# Patient Record
Sex: Female | Born: 1995 | Race: Asian | Hispanic: No | Marital: Single | Smoking: Former smoker
Health system: Southern US, Community
[De-identification: ages and names within clinical notes are randomized; demographics above are authoritative.]

## PROBLEM LIST (undated history)

## (undated) ENCOUNTER — Inpatient Hospital Stay (HOSPITAL_COMMUNITY): Payer: Self-pay

## (undated) ENCOUNTER — Emergency Department (HOSPITAL_COMMUNITY): Discharge status: Absent without leave | Payer: Self-pay

## (undated) DIAGNOSIS — Z789 Other specified health status: Secondary | ICD-10-CM

## (undated) HISTORY — PX: WISDOM TOOTH EXTRACTION: SHX21

## (undated) HISTORY — PX: ANKLE SURGERY: SHX546

---
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
## 2018-04-01 ENCOUNTER — Ambulatory Visit (INDEPENDENT_AMBULATORY_CARE_PROVIDER_SITE_OTHER)
Admission: EM | Admit: 2018-04-01 | Discharge: 2018-04-01 | Disposition: A | Discharge status: Home / Self Care | Payer: 59 | Source: Home / Self Care | Attending: Family Medicine | Admitting: Family Medicine

## 2018-04-01 ENCOUNTER — Inpatient Hospital Stay (HOSPITAL_COMMUNITY)
Admission: AD | Admit: 2018-04-01 | Discharge: 2018-04-01 | Disposition: A | Discharge status: Home / Self Care | Payer: 59 | Source: Ambulatory Visit | Attending: Obstetrics and Gynecology | Admitting: Obstetrics and Gynecology

## 2018-04-01 ENCOUNTER — Encounter (HOSPITAL_COMMUNITY): Payer: Self-pay | Admitting: *Deleted

## 2018-04-01 ENCOUNTER — Encounter (HOSPITAL_COMMUNITY): Payer: Self-pay | Admitting: Emergency Medicine

## 2018-04-01 ENCOUNTER — Other Ambulatory Visit: Payer: Self-pay

## 2018-04-01 DIAGNOSIS — Z87891 Personal history of nicotine dependence: Secondary | ICD-10-CM | POA: Diagnosis not present

## 2018-04-01 DIAGNOSIS — Z3A01 Less than 8 weeks gestation of pregnancy: Secondary | ICD-10-CM | POA: Diagnosis not present

## 2018-04-01 DIAGNOSIS — R112 Nausea with vomiting, unspecified: Secondary | ICD-10-CM | POA: Diagnosis present

## 2018-04-01 DIAGNOSIS — O210 Mild hyperemesis gravidarum: Secondary | ICD-10-CM

## 2018-04-01 DIAGNOSIS — Z3201 Encounter for pregnancy test, result positive: Secondary | ICD-10-CM

## 2018-04-01 DIAGNOSIS — O219 Vomiting of pregnancy, unspecified: Secondary | ICD-10-CM | POA: Insufficient documentation

## 2018-04-01 HISTORY — DX: Other specified health status: Z78.9

## 2018-04-01 LAB — POCT PREGNANCY, URINE: Preg Test, Ur: POSITIVE — AB

## 2018-04-01 LAB — URINALYSIS, ROUTINE W REFLEX MICROSCOPIC
Bilirubin Urine: NEGATIVE
GLUCOSE, UA: NEGATIVE mg/dL
Hgb urine dipstick: NEGATIVE
Ketones, ur: NEGATIVE mg/dL
LEUKOCYTES UA: NEGATIVE
NITRITE: NEGATIVE
PROTEIN: NEGATIVE mg/dL
Specific Gravity, Urine: 1.021 (ref 1.005–1.030)
pH: 7 (ref 5.0–8.0)

## 2018-04-01 MED ORDER — PYRIDOXINE HCL 25 MG PO TABS: 25.0000 mg | ORAL_TABLET | Freq: Three times a day (TID) | ORAL | 0 refills | Status: DC

## 2018-04-01 MED ORDER — DOXYLAMINE SUCCINATE (SLEEP) 25 MG PO TABS: 25.0000 mg | ORAL_TABLET | Freq: Three times a day (TID) | ORAL | 0 refills | Status: DC | PRN

## 2018-04-01 NOTE — Discharge Instructions (Signed)

## 2018-04-01 NOTE — ED Triage Notes (Signed)
Pt here for nausea and indigestion with vomiting x 2 weeks; pt sts LMP was 7/17 and had positive home pregnancy test

## 2018-04-01 NOTE — Discharge Instructions (Signed)
Go to the [facility]  Check in maternity admissions

## 2018-04-01 NOTE — ED Provider Notes (Signed)
[facility]    CSN: [id number] Arrival date & time: 04/01/18  1207     History   Chief Complaint Chief Complaint  Patient presents with  . Nausea    HPI Megan Leach is a 22 y.o. female.   HPI  She states her last menstrual period was 02/12/2018.  She had a home pregnancy test.  She knows that she is pregnant.  She is a 17-month-old at home.  She is happy to be pregnant.  She is here with her husband.  She states about 2 weeks ago she started vomiting.  She has been vomiting several times a day every day.  She feels like she cannot hold down any solid food.  Sometimes she vomits even after drinking water.  She does not feel like she is dehydrated.  She states that she has no appetite and she is afraid to eat because she throws up so much.  She does have nausea even when she has not been eating.  No diarrhea.  No abdominal pain.  No vaginal bleeding or cramping.  Past Medical History:  Diagnosis Date  . Medical history non-contributory     There are no active problems to display for this patient.   Past Surgical History:  Procedure Laterality Date  . ANKLE SURGERY    . WISDOM TOOTH EXTRACTION      OB History    Gravida  2   Para  1   Term      Preterm      AB      Living  1     SAB      TAB      Ectopic      Multiple      Live Births               Home Medications    Prior to Admission medications   Medication Sig Start Date End Date Taking? Authorizing Provider  doxylamine, Sleep, (UNISOM) 25 MG tablet Take 1 tablet (25 mg total) by mouth every 8 (eight) hours as needed. 04/01/18   Calvert Cantor, CNM  pyridOXINE (VITAMIN B-6) 25 MG tablet Take 1 tablet (25 mg total) by mouth every 8 (eight) hours. 04/01/18   Calvert Cantor, CNM    Family History History reviewed. No pertinent family history. Patient states no significant family history, heart disease, cancers, strokes Social History Social History   Tobacco Use  .  Smoking status: Former Games developer  . Smokeless tobacco: Never Used  Substance Use Topics  . Alcohol use: Not Currently  . Drug use: Never     Allergies   Patient has no known allergies.   Review of Systems Review of Systems  Constitutional: Negative for chills and fever.  HENT: Negative for ear pain and sore throat.   Eyes: Negative for pain and visual disturbance.  Respiratory: Negative for cough and shortness of breath.   Cardiovascular: Negative for chest pain and palpitations.  Gastrointestinal: Positive for nausea and vomiting. Negative for abdominal pain.  Genitourinary: Negative for dysuria and hematuria.  Musculoskeletal: Negative for arthralgias and back pain.  Skin: Negative for color change and rash.  Neurological: Negative for seizures and syncope.  All other systems reviewed and are negative.    Physical Exam Triage Vital Signs ED Triage Vitals [04/01/18 1248]  Enc Vitals Group     BP 131/68     Pulse Rate 73     Resp 18     Temp  98.5 F (36.9 C)     Temp Source Oral     SpO2 100 %     Weight      Height      Head Circumference      Peak Flow      Pain Score      Pain Loc      Pain Edu?      Excl. in GC?    No data found.  Updated Vital Signs BP 131/68 (BP Location: Right Arm)   Pulse 73   Temp 98.5 F (36.9 C) (Oral)   Resp 18   LMP 02/12/2018   SpO2 100%      Physical Exam  Constitutional: She appears well-developed and well-nourished. No distress.  HENT:  Head: Normocephalic and atraumatic.  Mouth/Throat: Oropharynx is clear and moist.  Eyes: Pupils are equal, round, and reactive to light. Conjunctivae are normal.  Neck: Normal range of motion.  Cardiovascular: Normal rate.  Pulmonary/Chest: Effort normal. No respiratory distress.  Abdominal: Soft. She exhibits no distension.  Musculoskeletal: Normal range of motion. She exhibits no edema.  Neurological: She is alert.  Skin: Skin is warm and dry.  Vitals reviewed.    UC  Treatments / Results  Labs (all labs ordered are listed, but only abnormal results are displayed) Labs Reviewed - No data to display  EKG None  Radiology No results found.  Procedures Procedures (including critical care time)  Medications Ordered in UC Medications - No data to display  Initial Impression / Assessment and Plan / UC Course  I have reviewed the triage vital signs and the nursing notes.  Pertinent labs & imaging results that were available during my care of the patient were reviewed by me and considered in my medical decision making (see chart for details).     I called the obstetrician on call to ask about treatment.  I thought perhaps I could send her home with Unisom and pyridoxine, however, with her history that she had not eaten for 2 weeks was losing weight I was concerned about hyperemesis.  Dr. Jolayne Panther recommended that I send her to [facility] for evaluation. Final Clinical Impressions(s) / UC Diagnoses   Final diagnoses:  Hyperemesis gravidarum     Discharge Instructions     Go to the [facility]  Check in maternity admissions   ED Prescriptions    None     Controlled Substance Prescriptions [place] Controlled Substance Registry consulted? Not Applicable   Eustace Moore, MD 04/01/18 2121

## 2018-04-01 NOTE — MAU Note (Signed)
Been having really bad nausea and vomiting. Worse then first preg.  Unable to hold down, like any food.  Even small snacks.  Some pain/ discomfort in mid upper chest, ? Indigestion or heartburn.in upper abd, feels like there is a big bubble of pressure.

## 2018-04-01 NOTE — MAU Provider Note (Signed)
History     CSN: [id number]  Arrival date and time: 04/01/18 1344   First Provider Initiated Contact with Patient 04/01/18 1427      Chief Complaint  Patient presents with  . Nausea  . Possible Pregnancy   HPI  Kadasia Kassing is a 22 y.o. G2P1 at [redacted]w[redacted]d who presents to MAU with chief complaint of nausea and vomiting for the past week. Patient states she has been dry heaving throughout the day with "a few" episodes of vomiting daily. Now reports heartburn which worsens immediately after vomiting episodes. Denies vaginal bleeding, abdominal pain, fever, falls, or recent illness.    OB History    Gravida  2   Para  1   Term      Preterm      AB      Living  1     SAB      TAB      Ectopic      Multiple      Live Births              Past Medical History:  Diagnosis Date  . Medical history non-contributory     Past Surgical History:  Procedure Laterality Date  . ANKLE SURGERY    . WISDOM TOOTH EXTRACTION      History reviewed. No pertinent family history.  Social History   Tobacco Use  . Smoking status: Former Games developer  . Smokeless tobacco: Never Used  Substance Use Topics  . Alcohol use: Not Currently  . Drug use: Never    Allergies: No Known Allergies  No medications prior to admission.    Review of Systems  Constitutional: Negative for chills, fatigue and fever.  Gastrointestinal: Positive for nausea and vomiting.  All other systems reviewed and are negative.  Physical Exam   Blood pressure 112/73, pulse 73, temperature 98.3 F (36.8 C), temperature source Oral, resp. rate 17, weight 82.4 kg, last menstrual period 02/12/2018, SpO2 100 %.  Physical Exam  Nursing note and vitals reviewed. Constitutional: She is oriented to person, place, and time. She appears well-developed and well-nourished.  Cardiovascular: Normal rate.  Respiratory: Effort normal.  GI: Soft. She exhibits no distension. There is no tenderness. There is no rebound.   Genitourinary:  Genitourinary Comments: Not assessed based on HPI  Neurological: She is alert and oriented to person, place, and time. She has normal reflexes.  Skin: Skin is warm and dry.  Psychiatric: She has a normal mood and affect. Her behavior is normal. Judgment and thought content normal.    MAU Course  Procedures  MDM --[redacted]w[redacted]d by LMP --no abdominal cramping or bleeding --No concerning findings on physical exam --Discussed dietary changes in addition to rx  Patient Vitals for the past 24 hrs:  BP Temp Temp src Pulse Resp SpO2 Weight  04/01/18 1408 112/73 98.3 F (36.8 C) Oral 73 17 100 % 82.4 kg    Orders Placed This Encounter  Procedures  . Urinalysis, Routine w reflex microscopic  . Pregnancy, urine POC  . Discharge patient   Results for orders placed or performed during the hospital encounter of 04/01/18 (from the past 24 hour(s))  Urinalysis, Routine w reflex microscopic     Status: None   Collection Time: 04/01/18  2:12 PM  Result Value Ref Range   Color, Urine YELLOW YELLOW   APPearance CLEAR CLEAR   Specific Gravity, Urine 1.021 1.005 - 1.030   pH 7.0 5.0 - 8.0   Glucose, UA NEGATIVE  NEGATIVE mg/dL   Hgb urine dipstick NEGATIVE NEGATIVE   Bilirubin Urine NEGATIVE NEGATIVE   Ketones, ur NEGATIVE NEGATIVE mg/dL   Protein, ur NEGATIVE NEGATIVE mg/dL   Nitrite NEGATIVE NEGATIVE   Leukocytes, UA NEGATIVE NEGATIVE  Pregnancy, urine POC     Status: Abnormal   Collection Time: 04/01/18  2:16 PM  Result Value Ref Range   Preg Test, Ur POSITIVE (A) NEGATIVE   Meds ordered this encounter  Medications  . pyridOXINE (VITAMIN B-6) 25 MG tablet    Sig: Take 1 tablet (25 mg total) by mouth every 8 (eight) hours.    Dispense:  30 tablet    Refill:  0    Order Specific Question:   Supervising Provider    Answer:   Reva Bores [[id number]]  . doxylamine, Sleep, (UNISOM) 25 MG tablet    Sig: Take 1 tablet (25 mg total) by mouth every 8 (eight) hours as needed.     Dispense:  30 tablet    Refill:  0    Order Specific Question:   Supervising Provider    Answer:   Reva Bores [[id number]]   Assessment and Plan  --22 y.o. G2P1 at [redacted]w[redacted]d by LMP --Discussed dietary changes and generic Unisom as described above --Confirmed patient should start prenatal vitamin with folic acid when she can go 24 hours without vomiting --Given safe medication handout and list of [place] providers --Discharge home in stable condition  F/U: Patient to establish care with prenatal care provider  Calvert Cantor, CNM 04/01/2018, 2:56 PM

## 2018-04-22 LAB — OB RESULTS CONSOLE HIV ANTIBODY (ROUTINE TESTING): HIV: NONREACTIVE

## 2018-04-22 LAB — OB RESULTS CONSOLE HEPATITIS B SURFACE ANTIGEN: Hepatitis B Surface Ag: NEGATIVE

## 2018-04-22 LAB — OB RESULTS CONSOLE RUBELLA ANTIBODY, IGM: Rubella: IMMUNE

## 2018-04-22 LAB — OB RESULTS CONSOLE ABO/RH: RH Type: POSITIVE

## 2018-04-22 LAB — OB RESULTS CONSOLE ANTIBODY SCREEN: Antibody Screen: NEGATIVE

## 2018-04-22 LAB — OB RESULTS CONSOLE RPR: RPR: NONREACTIVE

## 2018-07-30 NOTE — L&D Delivery Note (Signed)
Delivery Note Patient progressed rapidly to 10 cm.  AROM with thick meconium was performed at 10 cm.  She pushed for < 15 minutes.  At 11:19 AM a viable female was delivered via Vaginal, Spontaneous (Presentation:LOA  ).  APGAR: 9, 9; weight pending .   Placenta delivered spontaneous and in tact.  Cord pH: n/a  Anesthesia:  Epidural, 1% lidocaine Episiotomy: None Lacerations: 1st degree;Right labial Suture Repair: 3.0 vicryl rapide Est. Blood Loss (mL): 562  Mom to postpartum.  Baby to Couplet care / Skin to Skin.  Mialani Reicks GEFFEL Lyriq Finerty 11/20/2018, 11:55 AM

## 2018-10-23 LAB — OB RESULTS CONSOLE GBS: GBS: POSITIVE

## 2018-10-23 LAB — OB RESULTS CONSOLE GC/CHLAMYDIA
Chlamydia: NEGATIVE
Gonorrhea: NEGATIVE

## 2018-11-19 ENCOUNTER — Inpatient Hospital Stay (EMERGENCY_DEPARTMENT_HOSPITAL)
Admission: AD | Admit: 2018-11-19 | Discharge: 2018-11-20 | Disposition: A | Discharge status: Home / Self Care | Payer: 59 | Source: Ambulatory Visit | Attending: Obstetrics and Gynecology | Admitting: Obstetrics and Gynecology

## 2018-11-19 ENCOUNTER — Other Ambulatory Visit: Payer: Self-pay

## 2018-11-19 ENCOUNTER — Encounter (HOSPITAL_COMMUNITY): Payer: Self-pay | Admitting: *Deleted

## 2018-11-19 ENCOUNTER — Other Ambulatory Visit: Payer: Self-pay | Admitting: Obstetrics and Gynecology

## 2018-11-19 DIAGNOSIS — O368130 Decreased fetal movements, third trimester, not applicable or unspecified: Secondary | ICD-10-CM

## 2018-11-19 DIAGNOSIS — Z87891 Personal history of nicotine dependence: Secondary | ICD-10-CM | POA: Insufficient documentation

## 2018-11-19 DIAGNOSIS — Z79899 Other long term (current) drug therapy: Secondary | ICD-10-CM

## 2018-11-19 DIAGNOSIS — H538 Other visual disturbances: Secondary | ICD-10-CM | POA: Insufficient documentation

## 2018-11-19 DIAGNOSIS — Z3A40 40 weeks gestation of pregnancy: Secondary | ICD-10-CM

## 2018-11-19 DIAGNOSIS — O26893 Other specified pregnancy related conditions, third trimester: Secondary | ICD-10-CM

## 2018-11-19 DIAGNOSIS — Z3689 Encounter for other specified antenatal screening: Secondary | ICD-10-CM

## 2018-11-19 DIAGNOSIS — O480 Post-term pregnancy: Secondary | ICD-10-CM | POA: Diagnosis not present

## 2018-11-19 LAB — URINALYSIS, ROUTINE W REFLEX MICROSCOPIC
Bilirubin Urine: NEGATIVE
Glucose, UA: NEGATIVE mg/dL
Hgb urine dipstick: NEGATIVE
Ketones, ur: NEGATIVE mg/dL
Nitrite: NEGATIVE
Protein, ur: NEGATIVE mg/dL
Specific Gravity, Urine: 1.013 (ref 1.005–1.030)
pH: 7 (ref 5.0–8.0)

## 2018-11-19 NOTE — MAU Note (Signed)
Pt had a mild headache, blurred vision and some dizziness this evening. She layed down in the dark and the headache and dizziness went away but the blurry vision remained.  Feels +FM but she feels like its hard to track b/c the movements are so subtle.  Denies any painful contractions, vag leaking or bleeding.

## 2018-11-19 NOTE — MAU Provider Note (Signed)
Chief Complaint:  No chief complaint on file.   First Provider Initiated Contact with Patient 11/19/18 2324      HPI: Megan Leach is a 23 y.o. G2P1 at 57w0dwho presents to maternity admissions reporting headache and intermittent blurred vision.  Headache stopped but vision stayed blurry.  Feels fetal movement is less than usual.  . She denies LOF, vaginal bleeding, vaginal itching/burning, urinary symptoms, h/a, dizziness, n/v, diarrhea, constipation or fever/chills.  She denies headache or RUQ abdominal pain.  RN Note: Pt had a mild headache, blurred vision and some dizziness this evening. She layed down in the dark and the headache and dizziness went away but the blurry vision remained.  Feels +FM but she feels like its hard to track b/c the movements are so subtle.  Denies any painful contractions, vag leaking or bleeding.  Past Medical History: Past Medical History:  Diagnosis Date  . Medical history non-contributory     Past obstetric history: OB History  Gravida Para Term Preterm AB Living  2 1       1   SAB TAB Ectopic Multiple Live Births               # Outcome Date GA Lbr Len/2nd Weight Sex Delivery Anes PTL Lv  2 Current           1 Para             Past Surgical History: Past Surgical History:  Procedure Laterality Date  . ANKLE SURGERY    . WISDOM TOOTH EXTRACTION      Family History: History reviewed. No pertinent family history.  Social History: Social History   Tobacco Use  . Smoking status: Former Smoker    Last attempt to quit: 02/17/2018    Years since quitting: 0.7  . Smokeless tobacco: Never Used  Substance Use Topics  . Alcohol use: Not Currently  . Drug use: Never    Allergies: No Known Allergies  Meds:  Medications Prior to Admission  Medication Sig Dispense Refill Last Dose  . prenatal vitamin w/FE, FA (PRENATAL 1 + 1) 27-1 MG TABS tablet Take 1 tablet by mouth daily at 12 noon.   11/19/2018 at Unknown time  . doxylamine, Sleep,  (UNISOM) 25 MG tablet Take 1 tablet (25 mg total) by mouth every 8 (eight) hours as needed. 30 tablet 0 More than a month at Unknown time  . pyridOXINE (VITAMIN B-6) 25 MG tablet Take 1 tablet (25 mg total) by mouth every 8 (eight) hours. 30 tablet 0 More than a month at Unknown time    I have reviewed patient's Past Medical Hx, Surgical Hx, Family Hx, Social Hx, medications and allergies.   ROS:  Review of Systems  Constitutional: Negative for chills and fever.  Eyes: Positive for visual disturbance.  Cardiovascular: Negative for leg swelling.  Gastrointestinal: Negative for abdominal pain, constipation, diarrhea and nausea.  Genitourinary: Negative for vaginal bleeding.  Neurological: Positive for headaches (now stopped).   Other systems negative  Physical Exam   Patient Vitals for the past 24 hrs:  BP Temp Temp src Pulse Resp Height Weight  11/19/18 2318 122/82 - - (!) 106 - - -  11/19/18 2314 123/84 - - (!) 108 - - -  11/19/18 2309 129/81 98 F (36.7 C) Oral (!) 106 15 5\' 7"  (1.702 m) 109.3 kg   Constitutional: Well-developed, well-nourished female in no acute distress.  Cardiovascular: normal rate and rhythm Respiratory: normal effort, clear to auscultation bilaterally GI:  Abd soft, non-tender, gravid appropriate for gestational age.   No rebound or guarding. MS: Extremities nontender, no edema, normal ROM Neurologic: Alert and oriented x 4.  GU: Neg CVAT.  PELVIC EXAM: deferred   FHT:  Baseline 140 , moderate variability, accelerations present, no decelerations Contractions: Irregular     Labs: Results for orders placed or performed during the hospital encounter of 11/19/18 (from the past 24 hour(s))  Urinalysis, Routine w reflex microscopic     Status: Abnormal   Collection Time: 11/19/18 11:26 PM  Result Value Ref Range   Color, Urine YELLOW YELLOW   APPearance HAZY (A) CLEAR   Specific Gravity, Urine 1.013 1.005 - 1.030   pH 7.0 5.0 - 8.0   Glucose, UA  NEGATIVE NEGATIVE mg/dL   Hgb urine dipstick NEGATIVE NEGATIVE   Bilirubin Urine NEGATIVE NEGATIVE   Ketones, ur NEGATIVE NEGATIVE mg/dL   Protein, ur NEGATIVE NEGATIVE mg/dL   Nitrite NEGATIVE NEGATIVE   Leukocytes,Ua MODERATE (A) NEGATIVE   RBC / HPF 0-5 0 - 5 RBC/hpf   WBC, UA 6-10 0 - 5 WBC/hpf   Bacteria, UA RARE (A) NONE SEEN   Squamous Epithelial / LPF 6-10 0 - 5     Imaging:  No results found.  MAU Course/MDM: I have ordered labs and reviewed results. UA is grossly normal.  NST reviewed and is reactive with accels. No decels  Treatments in MAU included EFM.    Assessment: Single intrauterine pregnancy at 8479w1d Decreased fetal movement Reactive nonstress test Blurry vision with normal blood pressures  Plan: Discharge home Labor precautions and fetal kick counts Follow up in Office for prenatal visits and recheck  Encouraged to return here or to other Urgent Care/ED if she develops worsening of symptoms, increase in pain, fever, or other concerning symptoms.   Pt stable at time of discharge.  Wynelle BourgeoisMarie Joseline Mccampbell CNM, MSN Certified Nurse-Midwife 11/19/2018 11:24 PM

## 2018-11-20 ENCOUNTER — Encounter (HOSPITAL_COMMUNITY): Payer: Self-pay | Admitting: *Deleted

## 2018-11-20 ENCOUNTER — Inpatient Hospital Stay (HOSPITAL_COMMUNITY): Payer: 59 | Admitting: Anesthesiology

## 2018-11-20 ENCOUNTER — Inpatient Hospital Stay (HOSPITAL_COMMUNITY): Payer: 59

## 2018-11-20 ENCOUNTER — Other Ambulatory Visit: Payer: Self-pay

## 2018-11-20 ENCOUNTER — Inpatient Hospital Stay (HOSPITAL_COMMUNITY)
Admission: AD | Admit: 2018-11-20 | Discharge: 2018-11-22 | DRG: 807 | Disposition: A | Discharge status: Home / Self Care | Payer: 59 | Attending: Obstetrics | Admitting: Obstetrics

## 2018-11-20 DIAGNOSIS — O480 Post-term pregnancy: Secondary | ICD-10-CM | POA: Diagnosis present

## 2018-11-20 DIAGNOSIS — O99824 Streptococcus B carrier state complicating childbirth: Secondary | ICD-10-CM | POA: Diagnosis present

## 2018-11-20 DIAGNOSIS — Z3A40 40 weeks gestation of pregnancy: Secondary | ICD-10-CM

## 2018-11-20 DIAGNOSIS — Z87891 Personal history of nicotine dependence: Secondary | ICD-10-CM | POA: Diagnosis not present

## 2018-11-20 DIAGNOSIS — O368130 Decreased fetal movements, third trimester, not applicable or unspecified: Secondary | ICD-10-CM | POA: Diagnosis not present

## 2018-11-20 LAB — CBC
HCT: 38.5 % (ref 36.0–46.0)
Hemoglobin: 12.5 g/dL (ref 12.0–15.0)
MCH: 25 pg — ABNORMAL LOW (ref 26.0–34.0)
MCHC: 32.5 g/dL (ref 30.0–36.0)
MCV: 77 fL — ABNORMAL LOW (ref 80.0–100.0)
Platelets: 292 10*3/uL (ref 150–400)
RBC: 5 MIL/uL (ref 3.87–5.11)
RDW: 15.2 % (ref 11.5–15.5)
WBC: 11.8 10*3/uL — ABNORMAL HIGH (ref 4.0–10.5)
nRBC: 0 % (ref 0.0–0.2)

## 2018-11-20 LAB — ABO/RH: ABO/RH(D): O POS

## 2018-11-20 LAB — TYPE AND SCREEN
ABO/RH(D): O POS
Antibody Screen: NEGATIVE

## 2018-11-20 MED ORDER — ONDANSETRON HCL 4 MG/2ML IJ SOLN: 4.0000 mg | Freq: Four times a day (QID) | INTRAMUSCULAR | Status: DC | PRN

## 2018-11-20 MED ORDER — PHENYLEPHRINE 40 MCG/ML (10ML) SYRINGE FOR IV PUSH (FOR BLOOD PRESSURE SUPPORT): 80.0000 ug | PREFILLED_SYRINGE | INTRAVENOUS | Status: DC | PRN

## 2018-11-20 MED ORDER — OXYCODONE-ACETAMINOPHEN 5-325 MG PO TABS: 1.0000 | ORAL_TABLET | ORAL | Status: DC | PRN

## 2018-11-20 MED ORDER — ONDANSETRON HCL 4 MG PO TABS: 4.0000 mg | ORAL_TABLET | ORAL | Status: DC | PRN

## 2018-11-20 MED ORDER — ACETAMINOPHEN 325 MG PO TABS: 650.0000 mg | ORAL_TABLET | ORAL | Status: DC | PRN

## 2018-11-20 MED ORDER — DIBUCAINE (PERIANAL) 1 % EX OINT: 1.0000 "application " | TOPICAL_OINTMENT | CUTANEOUS | Status: DC | PRN

## 2018-11-20 MED ORDER — OXYTOCIN BOLUS FROM INFUSION
500.0000 mL | Freq: Once | INTRAVENOUS | Status: AC
  Administered 2018-11-20: 500 mL via INTRAVENOUS

## 2018-11-20 MED ORDER — DIPHENHYDRAMINE HCL 25 MG PO CAPS: 25.0000 mg | ORAL_CAPSULE | Freq: Four times a day (QID) | ORAL | Status: DC | PRN

## 2018-11-20 MED ORDER — PHENYLEPHRINE 40 MCG/ML (10ML) SYRINGE FOR IV PUSH (FOR BLOOD PRESSURE SUPPORT)
80.0000 ug | PREFILLED_SYRINGE | INTRAVENOUS | Status: DC | PRN
  Filled 2018-11-20: qty 10

## 2018-11-20 MED ORDER — OXYCODONE HCL 5 MG PO TABS: 5.0000 mg | ORAL_TABLET | ORAL | Status: DC | PRN

## 2018-11-20 MED ORDER — BENZOCAINE-MENTHOL 20-0.5 % EX AERO
1.0000 "application " | INHALATION_SPRAY | CUTANEOUS | Status: DC | PRN
  Administered 2018-11-22: 1 via TOPICAL
  Filled 2018-11-20: qty 56

## 2018-11-20 MED ORDER — OXYCODONE HCL 5 MG PO TABS: 10.0000 mg | ORAL_TABLET | ORAL | Status: DC | PRN

## 2018-11-20 MED ORDER — LIDOCAINE HCL (PF) 1 % IJ SOLN
30.0000 mL | INTRAMUSCULAR | Status: AC | PRN
  Administered 2018-11-20: 30 mL via SUBCUTANEOUS
  Filled 2018-11-20: qty 30

## 2018-11-20 MED ORDER — COCONUT OIL OIL
1.0000 "application " | TOPICAL_OIL | Status: DC | PRN
  Administered 2018-11-22: 1 via TOPICAL

## 2018-11-20 MED ORDER — EPHEDRINE 5 MG/ML INJ: 10.0000 mg | INTRAVENOUS | Status: DC | PRN

## 2018-11-20 MED ORDER — SIMETHICONE 80 MG PO CHEW: 80.0000 mg | CHEWABLE_TABLET | ORAL | Status: DC | PRN

## 2018-11-20 MED ORDER — SOD CITRATE-CITRIC ACID 500-334 MG/5ML PO SOLN: 30.0000 mL | ORAL | Status: DC | PRN

## 2018-11-20 MED ORDER — TERBUTALINE SULFATE 1 MG/ML IJ SOLN: 0.2500 mg | Freq: Once | INTRAMUSCULAR | Status: DC | PRN

## 2018-11-20 MED ORDER — IBUPROFEN 600 MG PO TABS
600.0000 mg | ORAL_TABLET | Freq: Four times a day (QID) | ORAL | Status: DC
  Administered 2018-11-20 – 2018-11-22 (×8): 600 mg via ORAL
  Filled 2018-11-20 (×8): qty 1

## 2018-11-20 MED ORDER — SODIUM CHLORIDE (PF) 0.9 % IJ SOLN
INTRAMUSCULAR | Status: DC | PRN
  Administered 2018-11-20: 12 mL/h via EPIDURAL

## 2018-11-20 MED ORDER — LACTATED RINGERS IV SOLN: 500.0000 mL | INTRAVENOUS | Status: DC | PRN

## 2018-11-20 MED ORDER — FENTANYL CITRATE (PF) 100 MCG/2ML IJ SOLN: 50.0000 ug | INTRAMUSCULAR | Status: DC | PRN

## 2018-11-20 MED ORDER — LIDOCAINE HCL (PF) 1 % IJ SOLN
INTRAMUSCULAR | Status: DC | PRN
  Administered 2018-11-20 (×2): 4 mL via EPIDURAL

## 2018-11-20 MED ORDER — WITCH HAZEL-GLYCERIN EX PADS: 1.0000 "application " | MEDICATED_PAD | CUTANEOUS | Status: DC | PRN

## 2018-11-20 MED ORDER — SODIUM CHLORIDE 0.9 % IV SOLN
5.0000 10*6.[IU] | Freq: Once | INTRAVENOUS | Status: AC
  Administered 2018-11-20: 5 10*6.[IU] via INTRAVENOUS

## 2018-11-20 MED ORDER — SENNOSIDES-DOCUSATE SODIUM 8.6-50 MG PO TABS
2.0000 | ORAL_TABLET | ORAL | Status: DC
  Administered 2018-11-20 – 2018-11-21 (×2): 2 via ORAL
  Filled 2018-11-20 (×2): qty 2

## 2018-11-20 MED ORDER — OXYTOCIN 40 UNITS IN NORMAL SALINE INFUSION - SIMPLE MED
2.5000 [IU]/h | INTRAVENOUS | Status: DC
  Filled 2018-11-20: qty 1000

## 2018-11-20 MED ORDER — PENICILLIN G 3 MILLION UNITS IVPB - SIMPLE MED: 3.0000 10*6.[IU] | INTRAVENOUS | Status: DC

## 2018-11-20 MED ORDER — FENTANYL-BUPIVACAINE-NACL 0.5-0.125-0.9 MG/250ML-% EP SOLN
12.0000 mL/h | EPIDURAL | Status: DC | PRN
  Filled 2018-11-20: qty 250

## 2018-11-20 MED ORDER — ONDANSETRON HCL 4 MG/2ML IJ SOLN: 4.0000 mg | INTRAMUSCULAR | Status: DC | PRN

## 2018-11-20 MED ORDER — TETANUS-DIPHTH-ACELL PERTUSSIS 5-2.5-18.5 LF-MCG/0.5 IM SUSP: 0.5000 mL | Freq: Once | INTRAMUSCULAR | Status: DC

## 2018-11-20 MED ORDER — DIPHENHYDRAMINE HCL 50 MG/ML IJ SOLN: 12.5000 mg | INTRAMUSCULAR | Status: DC | PRN

## 2018-11-20 MED ORDER — LACTATED RINGERS IV SOLN: 500.0000 mL | Freq: Once | INTRAVENOUS | Status: DC

## 2018-11-20 MED ORDER — LACTATED RINGERS IV SOLN
INTRAVENOUS | Status: DC
  Administered 2018-11-20: 10:00:00 via INTRAVENOUS

## 2018-11-20 MED ORDER — PRENATAL MULTIVITAMIN CH
1.0000 | ORAL_TABLET | Freq: Every day | ORAL | Status: DC
  Administered 2018-11-21 – 2018-11-22 (×2): 1 via ORAL
  Filled 2018-11-20 (×2): qty 1

## 2018-11-20 MED ORDER — OXYCODONE-ACETAMINOPHEN 5-325 MG PO TABS: 2.0000 | ORAL_TABLET | ORAL | Status: DC | PRN

## 2018-11-20 MED ORDER — OXYTOCIN 40 UNITS IN NORMAL SALINE INFUSION - SIMPLE MED: 1.0000 m[IU]/min | INTRAVENOUS | Status: DC

## 2018-11-20 NOTE — Anesthesia Preprocedure Evaluation (Signed)
Anesthesia Evaluation  Patient identified by MRN, date of birth, ID band Patient awake    Reviewed: Allergy & Precautions, Patient's Chart, lab work & pertinent test results  History of Anesthesia Complications Negative for: history of anesthetic complications  Airway Mallampati: II  TM Distance: >3 FB Neck ROM: Full    Dental  (+) Teeth Intact, Dental Advisory Given   Pulmonary former smoker,    Pulmonary exam normal breath sounds clear to auscultation       Cardiovascular negative cardio ROS Normal cardiovascular exam Rhythm:Regular Rate:Normal     Neuro/Psych negative neurological ROS     GI/Hepatic negative GI ROS, Neg liver ROS,   Endo/Other  negative endocrine ROS  Renal/GU negative Renal ROS     Musculoskeletal negative musculoskeletal ROS (+)   Abdominal   Peds  Hematology negative hematology ROS (+)   Anesthesia Other Findings Day of surgery medications reviewed with the patient.  Reproductive/Obstetrics (+) Pregnancy                             Anesthesia Physical Anesthesia Plan  ASA: II  Anesthesia Plan: Epidural   Post-op Pain Management:    Induction:   PONV Risk Score and Plan: Treatment may vary due to age or medical condition  Airway Management Planned: Natural Airway  Additional Equipment:   Intra-op Plan:   Post-operative Plan:   Informed Consent: I have reviewed the patients History and Physical, chart, labs and discussed the procedure including the risks, benefits and alternatives for the proposed anesthesia with the patient or authorized representative who has indicated his/her understanding and acceptance.       Plan Discussed with: CRNA  Anesthesia Plan Comments:         Anesthesia Quick Evaluation

## 2018-11-20 NOTE — H&P (Signed)
23 y.o. G2P1 @ [redacted]w[redacted]d presents with IOL for post-term pregnancy.  Was seen last night in MAU for decreased FM.  Had a reactive NST and was discharged home.  Still notes movement is less than normal.  On arrival to L&D, she notes that she has been contracting painfully for the last two hours.  Otherwise has good fetal movement and no bleeding.  Past Medical History:  Diagnosis Date  . Medical history non-contributory     Past Surgical History:  Procedure Laterality Date  . ANKLE SURGERY    . WISDOM TOOTH EXTRACTION      OB History  Gravida Para Term Preterm AB Living  2 1 1     1   SAB TAB Ectopic Multiple Live Births          1    # Outcome Date GA Lbr Len/2nd Weight Sex Delivery Anes PTL Lv  2 Current           1 Term 12/03/16 [redacted]w[redacted]d  3090 g F Vag-Spont   LIV    Social History   Socioeconomic History  . Marital status: Single    Spouse name: Not on file  . Number of children: Not on file  . Years of education: Not on file  . Highest education level: Not on file  Occupational History  . Not on file  Social Needs  . Financial resource strain: Not on file  . Food insecurity:    Worry: Not on file    Inability: Not on file  . Transportation needs:    Medical: Not on file    Non-medical: Not on file  Tobacco Use  . Smoking status: Former Smoker    Last attempt to quit: 02/17/2018    Years since quitting: 0.7  . Smokeless tobacco: Never Used  Substance and Sexual Activity  . Alcohol use: Not Currently  . Drug use: Never  . Sexual activity: Not Currently   Patient has no known allergies.    Prenatal Transfer Tool  Maternal Diabetes: No Genetic Screening: Normal Maternal Ultrasounds/Referrals: Normal Fetal Ultrasounds or other Referrals:  None Maternal Substance Abuse:  No Significant Maternal Medications:  None Significant Maternal Lab Results: Lab values include: Other:  Hemoglobin E trait   ABO, Rh: O/Positive/-- (09/24 0000) Antibody: Negative (09/24  0000) Rubella: Immune (09/24 0000) RPR: Nonreactive (09/24 0000)  HBsAg: Negative (09/24 0000)  HIV: Non-reactive (09/24 0000)  GBS: Positive (03/26 0000)     Vitals:   11/20/18 0955  BP: 125/78  Pulse: 92  Resp: 20  Temp: 98.2 F (36.8 C)     General:  NAD Abdomen:  soft, gravid, EFW 6.5-7# Ex:  trace edema SVE:  6/100/-2 FHTs:  150s, moderate variability, occasional variable decel, category 2 Toco:  q2-4 min   A/P   23 y.o. G2P1 [redacted]w[redacted]d presents for IOL in labor Epidural now Category 2 fetal tracing.  Variability is moderate, overall reassuring. Fluid bolus now--will monitor closely  FSR/ vtx/ GBS positive--PCN  Quaneisha Hanisch GEFFEL Sylvan Sookdeo

## 2018-11-20 NOTE — Lactation Note (Signed)
This note was copied from a baby's chart. Lactation Consultation Note  Patient Name: Girl Macenzie Bertini [id number] Date: 11/20/2018 Reason for consult: Initial assessment;Term  94 hours old FT female who is being exclusively BF by her mother, she's a P2 but not very experienced BF. She was able to BF her first child only for a month and she experienced low milk supply issues; however, mom reported (+) breast changes with this pregnancy. Mom is already familiar with hand expression and able to see a few drops of colostrum when doing so. She has two [employer] breast pums at home, a hand pump and a DEBP.  Baby asleep on her bassinet when entering the room, offered assistance with latch but mom politely declined stating that baby wasn't due for a feeding yet. Asked mom to call for assistance when needed. Reviewed normal newborn behavior, feeding cues, cluster feeding and the onset of lactogenesis II.  Feeding plan:  1. Encouraged mom to feed baby STS 8-12 times/24 hours or sooner if feeding cues are present 2. Hand expression and spoon feeding were also encouraged  BF brochure, BF resources and feeding diary were reviewed. Parents reported all questions and concerns were answered, they're both aware of LC OP services and will call PRN.  Maternal Data Formula Feeding for Exclusion: No Has patient been taught Hand Expression?: Yes Does the patient have breastfeeding experience prior to this delivery?: Yes  Feeding Feeding Type: Breast Fed   Interventions Interventions: Breast feeding basics reviewed  Lactation Tools Discussed/Used WIC Program: No   Consult Status Consult Status: Follow-up Date: 11/21/18 Follow-up type: In-patient    Glyndon Tursi Venetia Constable 11/20/2018, 10:04 PM

## 2018-11-20 NOTE — Anesthesia Procedure Notes (Signed)

## 2018-11-20 NOTE — Discharge Instructions (Signed)

## 2018-11-20 NOTE — Anesthesia Postprocedure Evaluation (Signed)
Anesthesia Post Note  Patient: Megan Leach  Procedure(s) Performed: AN AD HOC LABOR EPIDURAL     Patient location during evaluation: Mother Baby Anesthesia Type: Epidural Level of consciousness: awake and alert Pain management: pain level controlled Vital Signs Assessment: post-procedure vital signs reviewed and stable Respiratory status: spontaneous breathing and nonlabored ventilation Cardiovascular status: blood pressure returned to baseline Postop Assessment: no headache, no backache and able to ambulate Anesthetic complications: no (pt stated epidural "did not work"; epidural was placed at 1033 and delivery at 1119)    Last Vitals:  Vitals:   11/20/18 1313 11/20/18 1445  BP: 109/71 117/65  Pulse: 82 85  Resp: 18 18  Temp: 37.1 C 36.8 C  SpO2: 98% 98%    Last Pain:  Vitals:   11/20/18 1445  TempSrc: Oral  PainSc:    Pain Goal:                   Marny Lowenstein

## 2018-11-21 LAB — CBC
HCT: 31.8 % — ABNORMAL LOW (ref 36.0–46.0)
Hemoglobin: 10.3 g/dL — ABNORMAL LOW (ref 12.0–15.0)
MCH: 25 pg — ABNORMAL LOW (ref 26.0–34.0)
MCHC: 32.4 g/dL (ref 30.0–36.0)
MCV: 77.2 fL — ABNORMAL LOW (ref 80.0–100.0)
Platelets: 262 10*3/uL (ref 150–400)
RBC: 4.12 MIL/uL (ref 3.87–5.11)
RDW: 15.2 % (ref 11.5–15.5)
WBC: 13.6 10*3/uL — ABNORMAL HIGH (ref 4.0–10.5)
nRBC: 0 % (ref 0.0–0.2)

## 2018-11-21 LAB — RPR: RPR Ser Ql: NONREACTIVE

## 2018-11-21 NOTE — Progress Notes (Signed)
Post Partum Day 1 Subjective: no complaints, up ad lib, voiding and tolerating PO  Objective: Blood pressure 123/80, pulse 83, temperature 98 F (36.7 C), temperature source Oral, resp. rate 18, height 5\' 7"  (1.702 m), weight 109.3 kg, last menstrual period 02/10/2018, SpO2 100 %, unknown if currently breastfeeding.  Physical Exam:  General: AOx3 Lochia: appropriate Uterine Fundus: firm DVT Evaluation: No evidence of DVT seen on physical exam.  Recent Labs    11/20/18 1000 11/21/18 0558  HGB 12.5 10.3*  HCT 38.5 31.8*    Assessment/Plan: Plan for discharge tomorrow   LOS: 1 day   Waynard Reeds 11/21/2018, 11:43 AM

## 2018-11-22 NOTE — Discharge Summary (Signed)
Obstetric Discharge Summary Reason for Admission: onset of labor Prenatal Procedures: NST and ultrasound Intrapartum Procedures: spontaneous vaginal delivery Postpartum Procedures: none Complications-Operative and Postpartum: none Hemoglobin  Date Value Ref Range Status  11/21/2018 10.3 (L) 12.0 - 15.0 g/dL Final   HCT  Date Value Ref Range Status  11/21/2018 31.8 (L) 36.0 - 46.0 % Final    Physical Exam:  General: alert, cooperative and appears stated age 77: appropriate Uterine Fundus: firm DVT Evaluation: No evidence of DVT seen on physical exam.  Discharge Diagnoses: Term Pregnancy-delivered  Discharge Information: Date: 11/22/2018 Activity: pelvic rest Diet: routine Medications: Ibuprofen Condition: improved Instructions: refer to practice specific booklet Discharge to: home Follow-up Information    Marlow Baars, MD Follow up in 4 week(s).   Specialty:  Obstetrics Why:  For a postpartum evaluation  Contact information: [address] [place] [address] [phone]           Newborn Data: Live born female  Birth Weight: 7 lb 9.3 oz (3440 g) APGAR: 9, 9  Newborn Delivery   Birth date/time:  11/20/2018 11:19:00 Delivery type:  Vaginal, Spontaneous     Home with mother.  Waynard Reeds 11/22/2018, 11:08 AM
